# Patient Record
Sex: Female | Born: 1990 | Race: Black or African American | Hispanic: No | Smoking: Never smoker
Health system: Southern US, Community
[De-identification: ages and names within clinical notes are randomized; demographics above are authoritative.]

## PROBLEM LIST (undated history)

## (undated) DIAGNOSIS — K219 Gastro-esophageal reflux disease without esophagitis: Secondary | ICD-10-CM

## (undated) DIAGNOSIS — M5126 Other intervertebral disc displacement, lumbar region: Secondary | ICD-10-CM

## (undated) DIAGNOSIS — R011 Cardiac murmur, unspecified: Secondary | ICD-10-CM

## (undated) HISTORY — DX: Gastro-esophageal reflux disease without esophagitis: K21.9

## (undated) HISTORY — PX: CLEFT PALATE REPAIR: SUR1165

---

## 2016-08-17 ENCOUNTER — Emergency Department (HOSPITAL_COMMUNITY): Payer: Managed Care, Other (non HMO)

## 2016-08-17 ENCOUNTER — Encounter (HOSPITAL_COMMUNITY): Payer: Self-pay

## 2016-08-17 ENCOUNTER — Emergency Department (HOSPITAL_COMMUNITY)
Admission: EM | Admit: 2016-08-17 | Discharge: 2016-08-17 | Disposition: A | Discharge status: Home / Self Care | Payer: Managed Care, Other (non HMO) | Attending: Emergency Medicine | Admitting: Emergency Medicine

## 2016-08-17 DIAGNOSIS — R51 Headache: Secondary | ICD-10-CM | POA: Insufficient documentation

## 2016-08-17 DIAGNOSIS — R0789 Other chest pain: Secondary | ICD-10-CM | POA: Insufficient documentation

## 2016-08-17 DIAGNOSIS — M542 Cervicalgia: Secondary | ICD-10-CM | POA: Diagnosis not present

## 2016-08-17 DIAGNOSIS — Y999 Unspecified external cause status: Secondary | ICD-10-CM | POA: Insufficient documentation

## 2016-08-17 DIAGNOSIS — Y939 Activity, unspecified: Secondary | ICD-10-CM | POA: Insufficient documentation

## 2016-08-17 DIAGNOSIS — Y92410 Unspecified street and highway as the place of occurrence of the external cause: Secondary | ICD-10-CM | POA: Insufficient documentation

## 2016-08-17 HISTORY — DX: Cardiac murmur, unspecified: R01.1

## 2016-08-17 HISTORY — DX: Other intervertebral disc displacement, lumbar region: M51.26

## 2016-08-17 LAB — I-STAT BETA HCG BLOOD, ED (MC, WL, AP ONLY): I-stat hCG, quantitative: <5 m[IU]/mL (ref <5)

## 2016-08-17 MED ORDER — CYCLOBENZAPRINE HCL 10 MG PO TABS
5.0000 mg | ORAL_TABLET | Freq: Once | ORAL | Status: DC
  Filled 2016-08-17: qty 1

## 2016-08-17 MED ORDER — CYCLOBENZAPRINE HCL 5 MG PO TABS: 5.0000 mg | ORAL_TABLET | Freq: Three times a day (TID) | ORAL | 0 refills | Status: DC | PRN

## 2016-08-17 MED ORDER — IBUPROFEN 200 MG PO TABS
600.0000 mg | ORAL_TABLET | Freq: Once | ORAL | Status: DC
Start: 2016-08-17 — End: 2016-08-18
  Filled 2016-08-17: qty 3

## 2016-08-17 MED ORDER — NAPROXEN 500 MG PO TABS: 500.0000 mg | ORAL_TABLET | Freq: Two times a day (BID) | ORAL | 0 refills | Status: DC

## 2016-08-17 NOTE — Discharge Instructions (Signed)
Read the information below.  Your x-rays were re-assuring - no acute abnormalities.  You may feel sore for the next 2-3 days. I have prescribed naprosyn and flexeril for relief. While taking naprosyn do not take other NSAIDs (ibuprofen, motrin, or aleve). Flexeril can make you drowsy, do not drive after taking.  You can apply heat/ice to affected areas for 20 minute increments.  Warm showers can soothe sore muscles.  If symptoms persist for more than a week follow up with your primary provider. In your discharge paperwork is a contact number to help establish a PCP.  Please keep your scheduled appointment with your chiropractor tomorrow.  Use the prescribed medication as directed.  Please discuss all new medications with your pharmacist.   You may return to the Emergency Department at any time for worsening condition or any new symptoms that concern you.
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
## 2016-08-17 NOTE — ED Notes (Signed)
Pt transported to CT ?

## 2016-08-17 NOTE — ED Notes (Signed)
Bed: [facility] Expected date:  Expected time:  Means of arrival:  Comments: 

## 2016-12-14 ENCOUNTER — Ambulatory Visit (INDEPENDENT_AMBULATORY_CARE_PROVIDER_SITE_OTHER): Payer: Managed Care, Other (non HMO)

## 2016-12-14 ENCOUNTER — Encounter (INDEPENDENT_AMBULATORY_CARE_PROVIDER_SITE_OTHER): Payer: Self-pay | Admitting: Orthopaedic Surgery

## 2016-12-14 ENCOUNTER — Ambulatory Visit (INDEPENDENT_AMBULATORY_CARE_PROVIDER_SITE_OTHER): Payer: Managed Care, Other (non HMO) | Admitting: Physician Assistant

## 2016-12-14 VITALS — Ht 63.0 in | Wt 138.0 lb

## 2016-12-14 DIAGNOSIS — M5441 Lumbago with sciatica, right side: Secondary | ICD-10-CM

## 2016-12-14 DIAGNOSIS — M542 Cervicalgia: Secondary | ICD-10-CM | POA: Diagnosis not present

## 2016-12-14 DIAGNOSIS — M5442 Lumbago with sciatica, left side: Secondary | ICD-10-CM | POA: Diagnosis not present

## 2016-12-14 DIAGNOSIS — M25551 Pain in right hip: Secondary | ICD-10-CM | POA: Diagnosis not present

## 2016-12-14 DIAGNOSIS — M25552 Pain in left hip: Secondary | ICD-10-CM

## 2016-12-14 DIAGNOSIS — G8929 Other chronic pain: Secondary | ICD-10-CM | POA: Diagnosis not present
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
## 2016-12-19 ENCOUNTER — Telehealth (INDEPENDENT_AMBULATORY_CARE_PROVIDER_SITE_OTHER): Payer: Self-pay | Admitting: *Deleted

## 2016-12-19 NOTE — Telephone Encounter (Signed)
See below

## 2016-12-19 NOTE — Telephone Encounter (Signed)
[facility] stating she called pt to schedule MRI and pt stated she will NOT schedule MRI if Dr. Vilinda BlanksBlackman/Clark has to read it, but will if her PT will read it to her, says she will NEVER come back here again. Elease Hashimotoatricia advised her that she can not sent the results to her PT because they did not order the procedure, pt states to go ahead and cancel the referral. Referral/order has been cancelled.
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
